# Patient Record
Sex: Female | Born: 1935 | Race: White | Hispanic: No | Smoking: Never smoker
Health system: Southern US, Community
[De-identification: ages and names within clinical notes are randomized; demographics above are authoritative.]

## PROBLEM LIST (undated history)

## (undated) DIAGNOSIS — F0390 Unspecified dementia without behavioral disturbance: Secondary | ICD-10-CM

## (undated) DIAGNOSIS — I499 Cardiac arrhythmia, unspecified: Secondary | ICD-10-CM

## (undated) DIAGNOSIS — N390 Urinary tract infection, site not specified: Secondary | ICD-10-CM

## (undated) HISTORY — PX: BREAST SURGERY: SHX581

## (undated) HISTORY — PX: ABDOMINAL HYSTERECTOMY: SHX81

## (undated) HISTORY — PX: CATARACT EXTRACTION: SUR2

---

## 2015-02-08 DIAGNOSIS — N390 Urinary tract infection, site not specified: Secondary | ICD-10-CM

## 2015-02-08 HISTORY — DX: Urinary tract infection, site not specified: N39.0

## 2016-02-07 ENCOUNTER — Inpatient Hospital Stay (HOSPITAL_COMMUNITY)
Admission: EM | Admit: 2016-02-07 | Discharge: 2016-02-09 | DRG: 689 | Disposition: A | Discharge status: Nursing Home (Includes ICF) | Payer: Medicare Other | Attending: Family Medicine | Admitting: Family Medicine

## 2016-02-07 ENCOUNTER — Emergency Department (HOSPITAL_COMMUNITY): Payer: Medicare Other

## 2016-02-07 ENCOUNTER — Encounter (HOSPITAL_COMMUNITY): Payer: Self-pay | Admitting: *Deleted

## 2016-02-07 DIAGNOSIS — E860 Dehydration: Secondary | ICD-10-CM | POA: Diagnosis present

## 2016-02-07 DIAGNOSIS — R4182 Altered mental status, unspecified: Secondary | ICD-10-CM

## 2016-02-07 DIAGNOSIS — G9340 Encephalopathy, unspecified: Secondary | ICD-10-CM | POA: Diagnosis present

## 2016-02-07 DIAGNOSIS — N390 Urinary tract infection, site not specified: Secondary | ICD-10-CM | POA: Diagnosis not present

## 2016-02-07 DIAGNOSIS — Z8249 Family history of ischemic heart disease and other diseases of the circulatory system: Secondary | ICD-10-CM

## 2016-02-07 DIAGNOSIS — I482 Chronic atrial fibrillation, unspecified: Secondary | ICD-10-CM | POA: Diagnosis present

## 2016-02-07 DIAGNOSIS — R410 Disorientation, unspecified: Secondary | ICD-10-CM

## 2016-02-07 DIAGNOSIS — F0390 Unspecified dementia without behavioral disturbance: Secondary | ICD-10-CM | POA: Diagnosis present

## 2016-02-07 HISTORY — DX: Urinary tract infection, site not specified: N39.0

## 2016-02-07 HISTORY — DX: Cardiac arrhythmia, unspecified: I49.9

## 2016-02-07 HISTORY — DX: Unspecified dementia, unspecified severity, without behavioral disturbance, psychotic disturbance, mood disturbance, and anxiety: F03.90

## 2016-02-07 LAB — CBC WITH DIFFERENTIAL/PLATELET
Basophils Absolute: 0 10*3/uL (ref 0.0–0.1)
Basophils Relative: 0 %
EOS PCT: 0 %
Eosinophils Absolute: 0 10*3/uL (ref 0.0–0.7)
HCT: 38.3 % (ref 36.0–46.0)
Hemoglobin: 13.2 g/dL (ref 12.0–15.0)
LYMPHS ABS: 2.1 10*3/uL (ref 0.7–4.0)
LYMPHS PCT: 17 %
MCH: 31.8 pg (ref 26.0–34.0)
MCHC: 34.5 g/dL (ref 30.0–36.0)
MCV: 92.3 fL (ref 78.0–100.0)
MONO ABS: 1.2 10*3/uL — AB (ref 0.1–1.0)
Monocytes Relative: 9 %
Neutro Abs: 9.1 10*3/uL — ABNORMAL HIGH (ref 1.7–7.7)
Neutrophils Relative %: 74 %
PLATELETS: 257 10*3/uL (ref 150–400)
RBC: 4.15 MIL/uL (ref 3.87–5.11)
RDW: 13.4 % (ref 11.5–15.5)
WBC: 12.4 10*3/uL — ABNORMAL HIGH (ref 4.0–10.5)

## 2016-02-07 LAB — URINALYSIS, ROUTINE W REFLEX MICROSCOPIC
Bilirubin Urine: NEGATIVE
GLUCOSE, UA: NEGATIVE mg/dL
KETONES UR: 5 mg/dL — AB
NITRITE: POSITIVE — AB
Protein, ur: NEGATIVE mg/dL
Specific Gravity, Urine: 1.017 (ref 1.005–1.030)
pH: 5 (ref 5.0–8.0)

## 2016-02-07 LAB — COMPREHENSIVE METABOLIC PANEL
ALT: 15 U/L (ref 14–54)
ANION GAP: 15 (ref 5–15)
AST: 22 U/L (ref 15–41)
Albumin: 3.5 g/dL (ref 3.5–5.0)
Alkaline Phosphatase: 55 U/L (ref 38–126)
BUN: 17 mg/dL (ref 6–20)
CHLORIDE: 97 mmol/L — AB (ref 101–111)
CO2: 21 mmol/L — ABNORMAL LOW (ref 22–32)
Calcium: 9.3 mg/dL (ref 8.9–10.3)
Creatinine, Ser: 0.98 mg/dL (ref 0.44–1.00)
GFR, EST NON AFRICAN AMERICAN: 53 mL/min — AB (ref >60)
Glucose, Bld: 100 mg/dL — ABNORMAL HIGH (ref 65–99)
POTASSIUM: 4.4 mmol/L (ref 3.5–5.1)
Sodium: 133 mmol/L — ABNORMAL LOW (ref 135–145)
Total Bilirubin: 0.7 mg/dL (ref 0.3–1.2)
Total Protein: 7 g/dL (ref 6.5–8.1)

## 2016-02-07 LAB — I-STAT CG4 LACTIC ACID, ED: Lactic Acid, Venous: 1.28 mmol/L (ref 0.5–1.9)

## 2016-02-07 NOTE — ED Triage Notes (Signed)
Pt bib ems from [facility], memory care unit; pt's daughter noted more confusion.  Hx of Alzheimers, dx Aug 2017.  Last seen normal 1700.  EKG unremarkable.  Stroke screen negative.  No other complaints per EMS.  134/86, A fib 90-120, CBG 140, Resp 16.

## 2016-02-07 NOTE — ED Provider Notes (Signed)
[facility]-EMERGENCY DEPT Provider Note   CSN: [id number] Arrival date & time: 02/07/16  2035     History   Chief Complaint Chief Complaint  Patient presents with  . Altered Mental Status    HPI Wanda AfricaBarbara Ramos is a 83 y.o. female.  The history is provided by the patient and a relative (both daughters are present at the bedside).  83 year old female with a past medical history of moderate dementia who presents today with altered mental status starting approximately 2 hours ago. Patient resides in an independent living at assisted living. Daughters relate that around 5 or 6 PM the patient had dinner. Later on that evening, the daughter was contacted by neighbor stated that the patient wanted to speak with her. Stated they noted that the patient was more confused than usual. States at baseline the patient is able to carry on a conversation. She is able to care for herself. They state that her behavior is very unusual for her.  Patient denies any hematuria, dysuria, cough, congestion, rash. Daughters do note that last week the patient had what seemed to be a cold. She is having cough and congestion. Was put on Mucinex for this.  History reviewed. No pertinent past medical history.  Patient Active Problem List   Diagnosis Date Noted  . Encephalopathy 02/08/2016    No past surgical history on file.  OB History    No data available       Home Medications    Prior to Admission medications   Medication Sig Start Date End Date Taking? Authorizing Provider  atenolol (TENORMIN) 25 MG tablet Take 25 mg by mouth daily.   Yes Historical Provider, MD  dabigatran (PRADAXA) 150 MG CAPS capsule Take 150 mg by mouth 2 (two) times daily.   Yes Historical Provider, MD  Flaxseed, Linseed, (FLAX SEED OIL) 1000 MG CAPS Take 1,000 mg by mouth at bedtime.   Yes Historical Provider, MD  flecainide (TAMBOCOR) 50 MG tablet Take 50 mg by mouth 2 (two) times daily.   Yes Historical Provider, MD  loratadine  (CLARITIN) 10 MG tablet Take 10 mg by mouth daily.   Yes Historical Provider, MD  loratadine-pseudoephedrine (CLARITIN-D 12-HOUR) 5-120 MG tablet Take 1 tablet by mouth 2 (two) times daily.   Yes Historical Provider, MD  vitamin B-12 (CYANOCOBALAMIN) 1000 MCG tablet Take 1,000 mcg by mouth daily.   Yes Historical Provider, MD  vitamin E 1000 UNIT capsule Take 1,000 Units by mouth daily.   Yes Historical Provider, MD    Family History No family history on file.  Social History Social History  Substance Use Topics  . Smoking status: Not on file  . Smokeless tobacco: Not on file  . Alcohol use Not on file     Allergies   Nitrofuran derivatives   Review of Systems Review of Systems  Constitutional: Positive for fatigue and fever. Negative for chills.  HENT: Positive for congestion (since last week).   Respiratory: Positive for cough. Negative for shortness of breath.   Gastrointestinal: Negative for diarrhea, nausea and vomiting.  Genitourinary: Negative for dysuria and hematuria.  Skin: Negative for pallor and rash.  Psychiatric/Behavioral: Positive for confusion (per daughters present at bedside).  All other systems reviewed and are negative.    Physical Exam Updated Vital Signs BP 126/78 (BP Location: Left Arm)   Pulse 97   Temp 100.6 F (38.1 C) (Oral)   Resp 16   SpO2 98%   Physical Exam  Constitutional: No distress.  Elderly and frail appearing  HENT:  Head: Normocephalic and atraumatic.  Eyes: Conjunctivae are normal.  Neck: Neck supple.  Cardiovascular: Normal rate and regular rhythm.   No murmur heard. Pulmonary/Chest: Effort normal and breath sounds normal. No respiratory distress.  Abdominal: Soft. There is no tenderness.  Musculoskeletal: Normal range of motion. She exhibits no edema.  Neurological: She is alert. She has normal strength. No cranial nerve deficit (CN II-XII intact) or sensory deficit. GCS eye subscore is 4. GCS verbal subscore is 4. GCS  motor subscore is 6.  Skin: Skin is warm and dry. She is not diaphoretic.  Psychiatric: She has a normal mood and affect.  Nursing note and vitals reviewed.    ED Treatments / Results  Labs (all labs ordered are listed, but only abnormal results are displayed) Labs Reviewed  CBC WITH DIFFERENTIAL/PLATELET - Abnormal; Notable for the following:       Result Value   WBC 12.4 (*)    Neutro Abs 9.1 (*)    Monocytes Absolute 1.2 (*)    All other components within normal limits  COMPREHENSIVE METABOLIC PANEL - Abnormal; Notable for the following:    Sodium 133 (*)    Chloride 97 (*)    CO2 21 (*)    Glucose, Bld 100 (*)    GFR calc non Af Amer 53 (*)    All other components within normal limits  URINALYSIS, ROUTINE W REFLEX MICROSCOPIC - Abnormal; Notable for the following:    APPearance HAZY (*)    Hgb urine dipstick MODERATE (*)    Ketones, ur 5 (*)    Nitrite POSITIVE (*)    Leukocytes, UA LARGE (*)    Bacteria, UA MANY (*)    Squamous Epithelial / LPF 0-5 (*)    All other components within normal limits  URINE CULTURE  CULTURE, BLOOD (ROUTINE X 2)  CULTURE, BLOOD (ROUTINE X 2)  I-STAT CG4 LACTIC ACID, ED  I-STAT CG4 LACTIC ACID, ED    EKG  EKG Interpretation  Date/Time:  Tuesday February 07 2016 21:41:35 EST Ventricular Rate:  113 PR Interval:    QRS Duration: 78 QT Interval:  336 QTC Calculation: 460 R Axis:   97 Text Interpretation:  Atrial flutter with variable A-V block Rightward axis Nonspecific ST abnormality Abnormal ECG No previous tracing Confirmed by KNOTT MD, DANIEL ([id number]) on 02/08/2016 12:13:55 AM       Radiology Ct Head Wo Contrast  Result Date: 02/07/2016 CLINICAL DATA:  Altered mental status, headache on anti coagulation. EXAM: CT HEAD WITHOUT CONTRAST TECHNIQUE: Contiguous axial images were obtained from the base of the skull through the vertex without intravenous contrast. COMPARISON:  None. FINDINGS: BRAIN: There is mild sulcal and moderate  ventricular prominence consistent with superficial and central atrophy. No intraparenchymal hemorrhage, mass effect nor midline shift. There are patchy periventricular and subcortical white matter hypodensities consistent with chronic small vessel ischemic disease. No acute large vascular territory infarcts. No abnormal extra-axial fluid collections. Basal cisterns are patent. VASCULAR: Moderate calcific atherosclerosis of the carotid siphons. SKULL: No skull fracture. No significant scalp soft tissue swelling. SINUSES/ORBITS: The mastoid air-cells are clear. The included paranasal sinuses are well-aerated.The included ocular globes and orbital contents are non-suspicious. OTHER: None. IMPRESSION: No acute intracranial abnormality. Superficial and central atrophy with chronic appearing small vessel ischemic disease of periventricular white matter. Electronically Signed   By: Tollie Eth M.D.   On: 02/07/2016 22:32    Procedures Procedures (including critical care time)  Medications Ordered in ED Medications  sodium chloride 0.9 % bolus 1,000 mL (not administered)  cefTRIAXone (ROCEPHIN) 1 g in dextrose 5 % 50 mL IVPB (not administered)  cefTRIAXone (ROCEPHIN) 1 g in dextrose 5 % 50 mL IVPB (not administered)     Initial Impression / Assessment and Plan / ED Course  I have reviewed the triage vital signs and the nursing notes.  Pertinent labs & imaging results that were available during my care of the patient were reviewed by me and considered in my medical decision making (see chart for details).  Clinical Course     Patient presents today with altered metal status starting approximately 2 hours prior to arrival. Patient has fever noted on arrival. She is nontoxic appearing but per daughters definitely has deviated from her baseline.  Patient is on anti-coagulation for atrial fibrillation. Obtain CT of her head without any Acute intracranial abnormalities. No evidence of pneumonia on chest  x-ray. Urinalysis consistent with acute urinary tract infection. Additionally patient has an elevated white blood cell count also supports the patient's likely infection. Cultures collected and given Rocephin.  Discussed with hospitalist and they're agreeable to admit. Patient stable the time of admission.  Final Clinical Impressions(s) / ED Diagnoses   Final diagnoses:  Lower urinary tract infectious disease  Delirium    New Prescriptions New Prescriptions   No medications on file     Madolyn Frieze, MD 02/08/16 0031    Lyndal Pulley, MD 02/08/16 [phone]

## 2016-02-08 ENCOUNTER — Encounter (HOSPITAL_COMMUNITY): Payer: Self-pay | Admitting: Internal Medicine

## 2016-02-08 DIAGNOSIS — F0390 Unspecified dementia without behavioral disturbance: Secondary | ICD-10-CM | POA: Diagnosis present

## 2016-02-08 DIAGNOSIS — N390 Urinary tract infection, site not specified: Secondary | ICD-10-CM | POA: Diagnosis not present

## 2016-02-08 DIAGNOSIS — E860 Dehydration: Secondary | ICD-10-CM | POA: Diagnosis present

## 2016-02-08 DIAGNOSIS — G9340 Encephalopathy, unspecified: Secondary | ICD-10-CM | POA: Diagnosis present

## 2016-02-08 DIAGNOSIS — R4182 Altered mental status, unspecified: Secondary | ICD-10-CM | POA: Diagnosis present

## 2016-02-08 DIAGNOSIS — Z8249 Family history of ischemic heart disease and other diseases of the circulatory system: Secondary | ICD-10-CM | POA: Diagnosis not present

## 2016-02-08 DIAGNOSIS — I482 Chronic atrial fibrillation, unspecified: Secondary | ICD-10-CM | POA: Diagnosis present

## 2016-02-08 LAB — BASIC METABOLIC PANEL
Anion gap: 10 (ref 5–15)
BUN: 13 mg/dL (ref 6–20)
CALCIUM: 8.4 mg/dL — AB (ref 8.9–10.3)
CHLORIDE: 102 mmol/L (ref 101–111)
CO2: 22 mmol/L (ref 22–32)
CREATININE: 0.88 mg/dL (ref 0.44–1.00)
GFR calc non Af Amer: 60 mL/min — ABNORMAL LOW (ref >60)
Glucose, Bld: 108 mg/dL — ABNORMAL HIGH (ref 65–99)
Potassium: 3.9 mmol/L (ref 3.5–5.1)
SODIUM: 134 mmol/L — AB (ref 135–145)

## 2016-02-08 LAB — CBC
HCT: 36.8 % (ref 36.0–46.0)
Hemoglobin: 12.4 g/dL (ref 12.0–15.0)
MCH: 31 pg (ref 26.0–34.0)
MCHC: 33.7 g/dL (ref 30.0–36.0)
MCV: 92 fL (ref 78.0–100.0)
PLATELETS: 253 10*3/uL (ref 150–400)
RBC: 4 MIL/uL (ref 3.87–5.11)
RDW: 13.4 % (ref 11.5–15.5)
WBC: 13.5 10*3/uL — AB (ref 4.0–10.5)

## 2016-02-08 LAB — CBG MONITORING, ED
Glucose-Capillary: 122 mg/dL — ABNORMAL HIGH (ref 65–99)
Glucose-Capillary: 105 mg/dL — ABNORMAL HIGH (ref 65–99)

## 2016-02-08 LAB — GLUCOSE, CAPILLARY
Glucose-Capillary: 81 mg/dL (ref 65–99)
Glucose-Capillary: 149 mg/dL — ABNORMAL HIGH (ref 65–99)

## 2016-02-08 LAB — INFLUENZA PANEL BY PCR (TYPE A & B)
INFLAPCR: NEGATIVE
Influenza B By PCR: NEGATIVE

## 2016-02-08 LAB — HEPARIN LEVEL (UNFRACTIONATED)
HEPARIN UNFRACTIONATED: 0.42 [IU]/mL (ref 0.30–0.70)
Heparin Unfractionated: 0.1 IU/mL — ABNORMAL LOW (ref 0.30–0.70)

## 2016-02-08 MED ORDER — SODIUM CHLORIDE 0.9 % IV BOLUS (SEPSIS)
1000.0000 mL | Freq: Once | INTRAVENOUS | Status: AC
  Administered 2016-02-08: 1000 mL via INTRAVENOUS

## 2016-02-08 MED ORDER — DEXTROSE 5 % IV SOLN
1.0000 g | INTRAVENOUS | Status: DC
  Administered 2016-02-09: 1 g via INTRAVENOUS
  Filled 2016-02-08: qty 10

## 2016-02-08 MED ORDER — ACETAMINOPHEN 650 MG RE SUPP: 650.0000 mg | Freq: Four times a day (QID) | RECTAL | Status: DC | PRN

## 2016-02-08 MED ORDER — METOPROLOL TARTRATE 5 MG/5ML IV SOLN
2.5000 mg | Freq: Three times a day (TID) | INTRAVENOUS | Status: DC
  Administered 2016-02-08: 2.5 mg via INTRAVENOUS
  Filled 2016-02-08 (×3): qty 5

## 2016-02-08 MED ORDER — SODIUM CHLORIDE 0.9 % IV SOLN
INTRAVENOUS | Status: AC
  Administered 2016-02-08 (×2): via INTRAVENOUS

## 2016-02-08 MED ORDER — ONDANSETRON HCL 4 MG PO TABS: 4.0000 mg | ORAL_TABLET | Freq: Four times a day (QID) | ORAL | Status: DC | PRN

## 2016-02-08 MED ORDER — HEPARIN (PORCINE) IN NACL 100-0.45 UNIT/ML-% IJ SOLN
850.0000 [IU]/h | INTRAMUSCULAR | Status: DC
  Administered 2016-02-08: 700 [IU]/h via INTRAVENOUS
  Filled 2016-02-08: qty 250

## 2016-02-08 MED ORDER — FLECAINIDE ACETATE 50 MG PO TABS
50.0000 mg | ORAL_TABLET | Freq: Two times a day (BID) | ORAL | Status: DC
  Administered 2016-02-08 – 2016-02-09 (×3): 50 mg via ORAL
  Filled 2016-02-08 (×3): qty 1

## 2016-02-08 MED ORDER — CEFTRIAXONE SODIUM 1 G IJ SOLR
1.0000 g | Freq: Once | INTRAMUSCULAR | Status: AC
  Administered 2016-02-08: 1 g via INTRAVENOUS
  Filled 2016-02-08: qty 10

## 2016-02-08 MED ORDER — ONDANSETRON HCL 4 MG/2ML IJ SOLN: 4.0000 mg | Freq: Four times a day (QID) | INTRAMUSCULAR | Status: DC | PRN

## 2016-02-08 MED ORDER — ACETAMINOPHEN 325 MG PO TABS
650.0000 mg | ORAL_TABLET | Freq: Four times a day (QID) | ORAL | Status: DC | PRN
  Administered 2016-02-08 – 2016-02-09 (×2): 650 mg via ORAL
  Filled 2016-02-08 (×4): qty 2

## 2016-02-08 NOTE — Progress Notes (Signed)
Pharmacy Antibiotic Note  Wanda AfricaBarbara Witter is a 83 y.o. female admitted on 02/07/2016 with UTI.  Pharmacy has been consulted for Rocephin dosing.  Plan: Rocephin 1g IV Q24H.  Pharmacy will sign off.   Temp (24hrs), Avg:100.6 F (38.1 C), Min:100.6 F (38.1 C), Max:100.6 F (38.1 C)   Recent Labs Lab 02/07/16 2318 02/07/16 2329  WBC 12.4*  --   CREATININE 0.98  --   LATICACIDVEN  --  1.28     Allergies  Allergen Reactions  . Nitrofuran Derivatives Other (See Comments)    On MAR     Thank you for allowing pharmacy to be a part of this patient's care.  Vernard GamblesVeronda Richey Doolittle, PharmD, BCPS  02/08/2016 12:15 AM

## 2016-02-08 NOTE — Progress Notes (Signed)
ANTICOAGULATION CONSULT NOTE - Follow Up Consult  Pharmacy Consult for heparin Indication: atrial fibrillation  Labs:  Recent Labs  02/07/16 2318 02/08/16 0440 02/08/16 1314 02/08/16 2244  HGB 13.2 12.4  --   --   HCT 38.3 36.8  --   --   PLT 257 253  --   --   HEPARINUNFRC  --   --  0.10* 0.42  CREATININE 0.98 0.88  --   --     Assessment/Plan:  83yo female therapeutic on heparin after rate change. Will continue gtt at current rate and confirm stable with am labs.   Wanda GamblesVeronda Joaquim Tolen, PharmD, BCPS  02/08/2016,11:45 PM

## 2016-02-08 NOTE — Progress Notes (Signed)
ANTICOAGULATION CONSULT NOTE - Initial Consult  Pharmacy Consult for heparin Indication: atrial fibrillation  Allergies  Allergen Reactions  . Nitrofuran Derivatives Other (See Comments)    On MAR    Patient Measurements: Height: 5\' 3"  (160 cm) Weight: 113 lb 8 oz (51.5 kg) IBW/kg (Calculated) : 52.4  Vital Signs: Temp: 100.6 F (38.1 C) (01/02 2048) Temp Source: Oral (01/02 2048) BP: 128/78 (01/03 0215) Pulse Rate: 124 (01/03 0215)  Labs:  Recent Labs  02/07/16 2318  HGB 13.2  HCT 38.3  PLT 257  CREATININE 0.98    Estimated Creatinine Clearance: 36.6 mL/min (by C-G formula based on SCr of 0.98 mg/dL).   Medical History: Past Medical History:  Diagnosis Date  . Dysrhythmia     Assessment: 83yo female presents w/ worsening confusion (Alzheimer's Dx in August), found w/ UTI, to transition from Pradaxa to heparin gtt; last dose of Pradaxa 1/2 at 0800.  Goal of Therapy:  Heparin level 0.3-0.7 units/ml Monitor platelets by anticoagulation protocol: Yes   Plan:  Will begin heparin gtt at 700 units/hr and monitor heparin levels and CBC.  Vernard GamblesVeronda Johnathan Heskett, PharmD, BCPS  02/08/2016,3:11 AM

## 2016-02-08 NOTE — ED Notes (Signed)
Regular Diet Ordered for Patient. 

## 2016-02-08 NOTE — Progress Notes (Signed)
ANTICOAGULATION CONSULT NOTE   Pharmacy Consult for heparin Indication: atrial fibrillation  Allergies  Allergen Reactions  . Nitrofuran Derivatives Other (See Comments)    On MAR    Patient Measurements: Height: 5\' 3"  (160 cm) Weight: 113 lb 8 oz (51.5 kg) IBW/kg (Calculated) : 52.4  Vital Signs: Temp: 98.8 F (37.1 C) (01/03 0946) Temp Source: Oral (01/03 0946) BP: 110/64 (01/03 1245) Pulse Rate: 118 (01/03 1245)  Labs:  Recent Labs  02/07/16 2318 02/08/16 0440 02/08/16 1314  HGB 13.2 12.4  --   HCT 38.3 36.8  --   PLT 257 253  --   HEPARINUNFRC  --   --  0.10*  CREATININE 0.98 0.88  --     Estimated Creatinine Clearance: 40.8 mL/min (by C-G formula based on SCr of 0.88 mg/dL).  Assessment: 83yo female presents w/ worsening confusion (Alzheimer's Dx in August), found w/ UTI. She is on chronic pradaxa for history of afib but now transitioned to IV heparin. Initial heparin level is subtherapeutic at 0.1. No bleeding or other issues noted.   Goal of Therapy:  Heparin level 0.3-0.7 units/ml Monitor platelets by anticoagulation protocol: Yes   Plan:  Increase heparin gtt to 850 units/hr Check an 8 hr heparin level Daily heparin level and CBC  Lysle Pearlachel Ahlia Lemanski, PharmD, BCPS 02/08/2016 2:02 PM

## 2016-02-08 NOTE — Progress Notes (Signed)
Patient became increasingly alert and oriented after her arrival to 2W.  Patient was able to follow commands and answer questions appropriately.

## 2016-02-08 NOTE — ED Notes (Signed)
Patient became very restless, daughter stated shje gets like this when she has to use the bathroom. Assisted patient up to bedside commode patient  was incont. Of urine cleaned linens and repositioned in bed

## 2016-02-08 NOTE — H&P (Signed)
History and Physical    Wanda AfricaBarbara Talaga AVW:[id number] DOB: [birth year] DOA: 02/07/2016  PCP: Keane PoliceSLADE-HARTMAN, VENEZELA, MD  Patient coming from: Nursing home.  Chief Complaint: Confusion.  History provided. Patient's daughter.  HPI: Wanda Ramos is a 83 y.o. female with history of atrial fibrillation, dementia was brought to the ER after patient was found to be slightly confused last evening around 7 PM. As per patient's daughter patient did not have any nausea vomiting diarrhea chest pain or shortness of breath. Last one week patient has been having congestion for which patient has been taking antiallergy medications. In the ER patient is found be confused with fever and UA showing features consistent with UTI. On my exam patient is still confused but oriented to her name. CT head was unremarkable. Since patient also had throat congestion last chest x-ray was ordered along with influenza PCR which are pending.   ED Course: Patient was started on ceftriaxone for UTI. CT head was unremarkable.  Review of Systems: As per HPI, rest all negative.   Past Medical History:  Diagnosis Date  . Dysrhythmia     Past Surgical History:  Procedure Laterality Date  . BREAST SURGERY       reports that she has never smoked. She has never used smokeless tobacco. She reports that she does not drink alcohol or use drugs.  Allergies  Allergen Reactions  . Nitrofuran Derivatives Other (See Comments)    On MAR    Family History  Problem Relation Age of Onset  . Congestive Heart Failure Father     Prior to Admission medications   Medication Sig Start Date End Date Taking? Authorizing Provider  atenolol (TENORMIN) 25 MG tablet Take 25 mg by mouth daily.   Yes Historical Provider, MD  dabigatran (PRADAXA) 150 MG CAPS capsule Take 150 mg by mouth 2 (two) times daily.   Yes Historical Provider, MD  Flaxseed, Linseed, (FLAX SEED OIL) 1000 MG CAPS Take 1,000 mg by mouth at bedtime.   Yes Historical  Provider, MD  flecainide (TAMBOCOR) 50 MG tablet Take 50 mg by mouth 2 (two) times daily.   Yes Historical Provider, MD  loratadine (CLARITIN) 10 MG tablet Take 10 mg by mouth daily.   Yes Historical Provider, MD  loratadine-pseudoephedrine (CLARITIN-D 12-HOUR) 5-120 MG tablet Take 1 tablet by mouth 2 (two) times daily.   Yes Historical Provider, MD  vitamin B-12 (CYANOCOBALAMIN) 1000 MCG tablet Take 1,000 mcg by mouth daily.   Yes Historical Provider, MD  vitamin E 1000 UNIT capsule Take 1,000 Units by mouth daily.   Yes Historical Provider, MD    Physical Exam: Vitals:   02/07/16 2048 02/07/16 2348 02/08/16 0215  BP: 124/62 126/78 128/78  Pulse: 95 97 (!) 124  Resp: 19 16 20   Temp: 100.6 F (38.1 C)    TempSrc: Oral    SpO2: 92% 98% 98%      Constitutional: Moderately built and nourished. Vitals:   02/07/16 2048 02/07/16 2348 02/08/16 0215  BP: 124/62 126/78 128/78  Pulse: 95 97 (!) 124  Resp: 19 16 20   Temp: 100.6 F (38.1 C)    TempSrc: Oral    SpO2: 92% 98% 98%   Eyes: Anicteric no pallor. ENMT: No discharge from the ears eyes nose and mouth. Neck: No mass felt. No neck rigidity. Respiratory: No rhonchi or crepitations. Cardiovascular: S1 and S2 heard. Abdomen: Soft nontender bowel sounds present. No guarding or rigidity. Musculoskeletal: No edema. No joint effusion. Skin: No rash. Skin  appears warm. Neurologic: Patient is lethargic and confused. Psychiatric: Patient is lethargic and confused.   Labs on Admission: I have personally reviewed following labs and imaging studies  CBC:  Recent Labs Lab 02/07/16 2318  WBC 12.4*  NEUTROABS 9.1*  HGB 13.2  HCT 38.3  MCV 92.3  PLT 257   Basic Metabolic Panel:  Recent Labs Lab 02/07/16 2318  NA 133*  K 4.4  CL 97*  CO2 21*  GLUCOSE 100*  BUN 17  CREATININE 0.98  CALCIUM 9.3   GFR: CrCl cannot be calculated (Unknown ideal weight.). Liver Function Tests:  Recent Labs Lab 02/07/16 2318  AST 22    ALT 15  ALKPHOS 55  BILITOT 0.7  PROT 7.0  ALBUMIN 3.5   No results for input(s): LIPASE, AMYLASE in the last 168 hours. No results for input(s): AMMONIA in the last 168 hours. Coagulation Profile: No results for input(s): INR, PROTIME in the last 168 hours. Cardiac Enzymes: No results for input(s): CKTOTAL, CKMB, CKMBINDEX, TROPONINI in the last 168 hours. BNP (last 3 results) No results for input(s): PROBNP in the last 8760 hours. HbA1C: No results for input(s): HGBA1C in the last 72 hours. CBG: No results for input(s): GLUCAP in the last 168 hours. Lipid Profile: No results for input(s): CHOL, HDL, LDLCALC, TRIG, CHOLHDL, LDLDIRECT in the last 72 hours. Thyroid Function Tests: No results for input(s): TSH, T4TOTAL, FREET4, T3FREE, THYROIDAB in the last 72 hours. Anemia Panel: No results for input(s): VITAMINB12, FOLATE, FERRITIN, TIBC, IRON, RETICCTPCT in the last 72 hours. Urine analysis:    Component Value Date/Time   COLORURINE YELLOW 02/07/2016 2310   APPEARANCEUR HAZY (A) 02/07/2016 2310   LABSPEC 1.017 02/07/2016 2310   PHURINE 5.0 02/07/2016 2310   GLUCOSEU NEGATIVE 02/07/2016 2310   HGBUR MODERATE (A) 02/07/2016 2310   BILIRUBINUR NEGATIVE 02/07/2016 2310   KETONESUR 5 (A) 02/07/2016 2310   PROTEINUR NEGATIVE 02/07/2016 2310   NITRITE POSITIVE (A) 02/07/2016 2310   LEUKOCYTESUR LARGE (A) 02/07/2016 2310   Sepsis Labs: @LABRCNTIP (procalcitonin:4,lacticidven:4) )No results found for this or any previous visit (from the past 240 hour(s)).   Radiological Exams on Admission: Ct Head Wo Contrast  Result Date: 02/07/2016 CLINICAL DATA:  Altered mental status, headache on anti coagulation. EXAM: CT HEAD WITHOUT CONTRAST TECHNIQUE: Contiguous axial images were obtained from the base of the skull through the vertex without intravenous contrast. COMPARISON:  None. FINDINGS: BRAIN: There is mild sulcal and moderate ventricular prominence consistent with superficial and  central atrophy. No intraparenchymal hemorrhage, mass effect nor midline shift. There are patchy periventricular and subcortical white matter hypodensities consistent with chronic small vessel ischemic disease. No acute large vascular territory infarcts. No abnormal extra-axial fluid collections. Basal cisterns are patent. VASCULAR: Moderate calcific atherosclerosis of the carotid siphons. SKULL: No skull fracture. No significant scalp soft tissue swelling. SINUSES/ORBITS: The mastoid air-cells are clear. The included paranasal sinuses are well-aerated.The included ocular globes and orbital contents are non-suspicious. OTHER: None. IMPRESSION: No acute intracranial abnormality. Superficial and central atrophy with chronic appearing small vessel ischemic disease of periventricular white matter. Electronically Signed   By: Tollie Eth M.D.   On: 02/07/2016 22:32     Assessment/Plan Principal Problem:   Acute encephalopathy Active Problems:   Encephalopathy   Chronic atrial fibrillation (HCC)   Dementia   Lower urinary tract infectious disease    1. Acute encephalopathy most likely from UTI - patient is placed on ceftriaxone. Chest x-ray and influenza PCR pending. Continue with  hydration. If patient remains confused may need MRI brain. For now I'm keeping patient nothing by mouth until patient becomes more alert and awake. 2. Chronic atrial fibrillation - since patient is nothing by mouth I have placed patient on IV heparin. Patient's last dose of Pradaxa was yesterday morning. Patient is on beta blockers which I will order as IV until patient can swallow orally. Patient also takes Flecainide. 3. Dementia - presently encephalopathy.   DVT prophylaxis: Heparin. Code Status: Full code. But patient's daughter states that patient would not like to be on prolonged life-support.  Family Communication: Patient's daughter.  Disposition Plan: Back to nursing home.  Consults called: None.  Admission  status: Observation.    Eduard Clos MD [facility] Pager [phone].  If 7PM-7AM, please contact night-coverage [email or web address] Password [email or web address]  02/08/2016, 2:54 AM

## 2016-02-08 NOTE — ED Notes (Signed)
Sat patient up on the side of the bed she drank small amt . Of cranberry juice and took her medication. Daughter remains at bedside.

## 2016-02-08 NOTE — Progress Notes (Signed)
Patient seen and admitted earlier this AM by my associate. Please refer to H and P for details regarding assessment and plan.  Will reassess next am.  Gen: pt in nad CV: no cyanosis Pulm: equal chest rise  [name], Wanda Ramos

## 2016-02-08 NOTE — Progress Notes (Signed)
Patient arrived to 2W room 33.  Patient was oriented to unit and room to include call light and phone.  Telemetry monitor was applied and CCMD notified.  Will continue to monitor.

## 2016-02-08 NOTE — ED Notes (Signed)
Patient daughter came to desk and said her mother needed to void , so I went into room to assist patient to bedside toilet, and Patient told me she didn't have to void, so I said ok. And Daughter told me Thank you for coming. I told her you welcome.

## 2016-02-09 LAB — CBC
HCT: 37.5 % (ref 36.0–46.0)
Hemoglobin: 12.5 g/dL (ref 12.0–15.0)
MCH: 30.6 pg (ref 26.0–34.0)
MCHC: 33.3 g/dL (ref 30.0–36.0)
MCV: 91.9 fL (ref 78.0–100.0)
Platelets: 271 10*3/uL (ref 150–400)
RBC: 4.08 MIL/uL (ref 3.87–5.11)
RDW: 13.3 % (ref 11.5–15.5)
WBC: 11.2 10*3/uL — ABNORMAL HIGH (ref 4.0–10.5)

## 2016-02-09 LAB — GLUCOSE, CAPILLARY
GLUCOSE-CAPILLARY: 88 mg/dL (ref 65–99)
GLUCOSE-CAPILLARY: 98 mg/dL (ref 65–99)
Glucose-Capillary: 95 mg/dL (ref 65–99)
Glucose-Capillary: 185 mg/dL — ABNORMAL HIGH (ref 65–99)

## 2016-02-09 LAB — HEPARIN LEVEL (UNFRACTIONATED): HEPARIN UNFRACTIONATED: 0.34 [IU]/mL (ref 0.30–0.70)

## 2016-02-09 MED ORDER — DABIGATRAN ETEXILATE MESYLATE 150 MG PO CAPS
150.0000 mg | ORAL_CAPSULE | Freq: Two times a day (BID) | ORAL | Status: DC
  Administered 2016-02-09: 150 mg via ORAL
  Filled 2016-02-09: qty 1

## 2016-02-09 MED ORDER — CEFDINIR 300 MG PO CAPS: 300.0000 mg | ORAL_CAPSULE | Freq: Two times a day (BID) | ORAL | 0 refills | Status: AC

## 2016-02-09 MED ORDER — ATENOLOL 25 MG PO TABS
25.0000 mg | ORAL_TABLET | Freq: Every day | ORAL | Status: DC
  Administered 2016-02-09: 25 mg via ORAL
  Filled 2016-02-09: qty 1

## 2016-02-09 NOTE — Care Management Note (Signed)
Case Management Note Donn PieriniKristi Truman Aceituno RN, BSN Unit 2W-Case Manager [phone]  Patient Details  Name: Wanda AfricaBarbara Ramos MRN: [id number] Date of Birth: 1932-12-19  Subjective/Objective:  Pt admitted with acute encephalopathy                  Action/Plan: P[facility]- plan to return to ALF- CSW assisting with d/c needs to return to facility.   Expected Discharge Date:      02/09/16            Expected Discharge Plan:  Assisted Living / Rest Home  In-House Referral:  Clinical Social Work  Discharge planning Services  CM Consult  Post Acute Care Choice:    Choice offered to:     DME Arranged:    DME Agency:     HH Arranged:    HH Agency:     Status of Service:  Completed, signed off  If discussed at [employer] Meetings, dates discussed:    Additional Comments:  Darrold SpanWebster, Yoshito Gaza Hall, RN 02/09/2016, 11:19 AM

## 2016-02-09 NOTE — Progress Notes (Signed)
Pt/family given discharge instructions, medication lists, follow up appointments, and when to call the doctor.  Pt/family verbalizes understanding. [facility]  Faxed copy of antibiotic prescription to facility to ensure availability. [name], [name], RN

## 2016-02-09 NOTE — Discharge Summary (Addendum)
Physician Discharge Summary  Wanda Ramos RUE:[id number] DOB: 1933-01-10 DOA: 02/07/2016  PCP: Keane Police, MD  Admit date: 02/07/2016 Discharge date: 02/09/2016  Time spent: > 35 minutes  Recommendations for Outpatient Follow-up:  1. Monitor white blood cell count 2. Monitor blood pressure at home 3. Follow up with urine culture results at hospital   Discharge Diagnoses:  Principal Problem:   Acute encephalopathy Active Problems:   Encephalopathy   Chronic atrial fibrillation (HCC)   Dementia   Lower urinary tract infectious disease   Discharge Condition: Stable  Diet recommendation: Heart healthy  Filed Weights   02/08/16 0307 02/08/16 1407  Weight: 51.5 kg (113 lb 8 oz) 49.1 kg (108 lb 4.8 oz)    History of present illness:  83 y.o. female with history of atrial fibrillation, dementia was brought to the ER after patient was found to be slightly confused. After evaluation patient was diagnosed with urinary tract infection.  Hospital Course:  Urinary tract infection - We'll continue Rocephin - Third generation cephalosporin as patient's condition improved on this. - Patient afebrile with blood cell count near-normal on day of discharge. Family reports patient's mentation back at baseline - We will treat with a prolonged antibiotic treatment regimen. Patient will require 6 more days to complete a seven-day treatment course  Altered mental status -Most likely secondary to dehydration and UTI. Improved with IV fluids and antibiotic regimen.  Procedures:  None  Consultations:  None  Discharge Exam: Vitals:   02/09/16 0413 02/09/16 0500  BP: 122/72 115/60  Pulse: (!) 101 82  Resp: 20   Temp: 99.5 F (37.5 C) 100 F (37.8 C)    General: Pt in nad. Alert and awake Cardiovascular: Irr, no rubs Respiratory: no increased wob, no wheezes  Discharge Instructions   Discharge Instructions    Call MD for:  extreme fatigue    Complete by:  As  directed    Call MD for:  temperature >100.4    Complete by:  As directed    Diet - low sodium heart healthy    Complete by:  As directed    Discharge instructions    Complete by:  As directed    Please be sure to have patient follow up with primary care physician next one or 2 weeks or sooner should any new concerns arise. Ensure patient finishes antibiotic regimen.   Increase activity slowly    Complete by:  As directed      Current Discharge Medication List    START taking these medications   Details  cefdinir (OMNICEF) 300 MG capsule Take 1 capsule (300 mg total) by mouth 2 (two) times daily. Qty: 12 capsule, Refills: 0      CONTINUE these medications which have NOT CHANGED   Details  atenolol (TENORMIN) 25 MG tablet Take 25 mg by mouth daily.    dabigatran (PRADAXA) 150 MG CAPS capsule Take 150 mg by mouth 2 (two) times daily.    Flaxseed, Linseed, (FLAX SEED OIL) 1000 MG CAPS Take 1,000 mg by mouth at bedtime.    flecainide (TAMBOCOR) 50 MG tablet Take 50 mg by mouth 2 (two) times daily.    vitamin B-12 (CYANOCOBALAMIN) 1000 MCG tablet Take 1,000 mcg by mouth daily.    vitamin E 1000 UNIT capsule Take 1,000 Units by mouth daily.      STOP taking these medications     loratadine (CLARITIN) 10 MG tablet      loratadine-pseudoephedrine (CLARITIN-D 12-HOUR) 5-120 MG tablet  Allergies  Allergen Reactions  . Nitrofuran Derivatives Other (See Comments)    On MAR      The results of significant diagnostics from this hospitalization (including imaging, microbiology, ancillary and laboratory) are listed below for reference.    Significant Diagnostic Studies: Dg Chest 2 View  Result Date: 02/08/2016 CLINICAL DATA:  [age] year old female with altered mental status. EXAM: CHEST  2 VIEW COMPARISON:  No priors. FINDINGS: Lung volumes are normal. No consolidative airspace disease. No pleural effusions. No pneumothorax. Several small nodular densities are noted in the  lungs bilaterally, 1 in the left apex, and 2 new the right lung base. These are not well demonstrated on the lateral projection. Pulmonary vasculature and the cardiomediastinal silhouette are within normal limits. Surgical clips in the right axillary region, likely from prior lymph node dissection. IMPRESSION: 1. No radiographic evidence of acute cardiopulmonary disease. 2. Several small nodular densities noted in the lungs bilaterally. Repeat standing PA and lateral chest radiograph is recommended in 1-2 months to confirm the persistence of these findings. Should these nodules persist, further evaluation with nonemergent noncontrast chest CT would be recommended. Electronically Signed   By: Trudie Reedaniel  Entrikin M.D.   On: 02/08/2016 09:03   Ct Head Wo Contrast  Result Date: 02/07/2016 CLINICAL DATA:  Altered mental status, headache on anti coagulation. EXAM: CT HEAD WITHOUT CONTRAST TECHNIQUE: Contiguous axial images were obtained from the base of the skull through the vertex without intravenous contrast. COMPARISON:  None. FINDINGS: BRAIN: There is mild sulcal and moderate ventricular prominence consistent with superficial and central atrophy. No intraparenchymal hemorrhage, mass effect nor midline shift. There are patchy periventricular and subcortical white matter hypodensities consistent with chronic small vessel ischemic disease. No acute large vascular territory infarcts. No abnormal extra-axial fluid collections. Basal cisterns are patent. VASCULAR: Moderate calcific atherosclerosis of the carotid siphons. SKULL: No skull fracture. No significant scalp soft tissue swelling. SINUSES/ORBITS: The mastoid air-cells are clear. The included paranasal sinuses are well-aerated.The included ocular globes and orbital contents are non-suspicious. OTHER: None. IMPRESSION: No acute intracranial abnormality. Superficial and central atrophy with chronic appearing small vessel ischemic disease of periventricular white  matter. Electronically Signed   By: Tollie Ethavid  Kwon M.D.   On: 02/07/2016 22:32    Microbiology: No results found for this or any previous visit (from the past 240 hour(s)).   Labs: Basic Metabolic Panel:  Recent Labs Lab 02/07/16 2318 02/08/16 0440  NA 133* 134*  K 4.4 3.9  CL 97* 102  CO2 21* 22  GLUCOSE 100* 108*  BUN 17 13  CREATININE 0.98 0.88  CALCIUM 9.3 8.4*   Liver Function Tests:  Recent Labs Lab 02/07/16 2318  AST 22  ALT 15  ALKPHOS 55  BILITOT 0.7  PROT 7.0  ALBUMIN 3.5   No results for input(s): LIPASE, AMYLASE in the last 168 hours. No results for input(s): AMMONIA in the last 168 hours. CBC:  Recent Labs Lab 02/07/16 2318 02/08/16 0440 02/09/16 0257  WBC 12.4* 13.5* 11.2*  NEUTROABS 9.1*  --   --   HGB 13.2 12.4 12.5  HCT 38.3 36.8 37.5  MCV 92.3 92.0 91.9  PLT 257 253 271   Cardiac Enzymes: No results for input(s): CKTOTAL, CKMB, CKMBINDEX, TROPONINI in the last 168 hours. BNP: BNP (last 3 results) No results for input(s): BNP in the last 8760 hours.  ProBNP (last 3 results) No results for input(s): PROBNP in the last 8760 hours.  CBG:  Recent Labs Lab 02/08/16 1611 02/08/16  2006 02/09/16 0011 02/09/16 0411 02/09/16 0805  GLUCAP 81 149* 88 95 98   Signed:  Penny Pia MD.  [facility] 02/09/2016, 9:54 AM   Addendum: Patient was initially somnolent and made nothing by mouth. She has become more alert and family reports feeding her food yesterday without any problems.

## 2016-02-09 NOTE — Progress Notes (Signed)
Clinical Social Worker facilitated patient discharge including contacting patient family and facility to confirm patient discharge plans.  Clinical information faxed to facility and family agreeable with plan.  Patient daughter stated she will drive patient back to facility via personalvechicle .  RN Dois DavenportSandra to call [phone] report prior to discharge.  Clinical Social Worker will sign off for now as social work intervention is no longer needed. Please consult us again if new need arises.  Marrianne MoodAshley Luisalberto Beegle, MSW, [employer]CSWA [phone]

## 2016-02-10 LAB — URINE CULTURE

## 2016-02-13 LAB — CULTURE, BLOOD (ROUTINE X 2)
CULTURE: NO GROWTH
CULTURE: NO GROWTH

## 2018-01-18 IMAGING — CR DG CHEST 2V
2 series · 2 of 2 positions shown · non-contrast
Comparison: No priors.

CLINICAL DATA: 83-year-old female with altered mental status.

EXAM:
CHEST  2 VIEW

[chest lat]
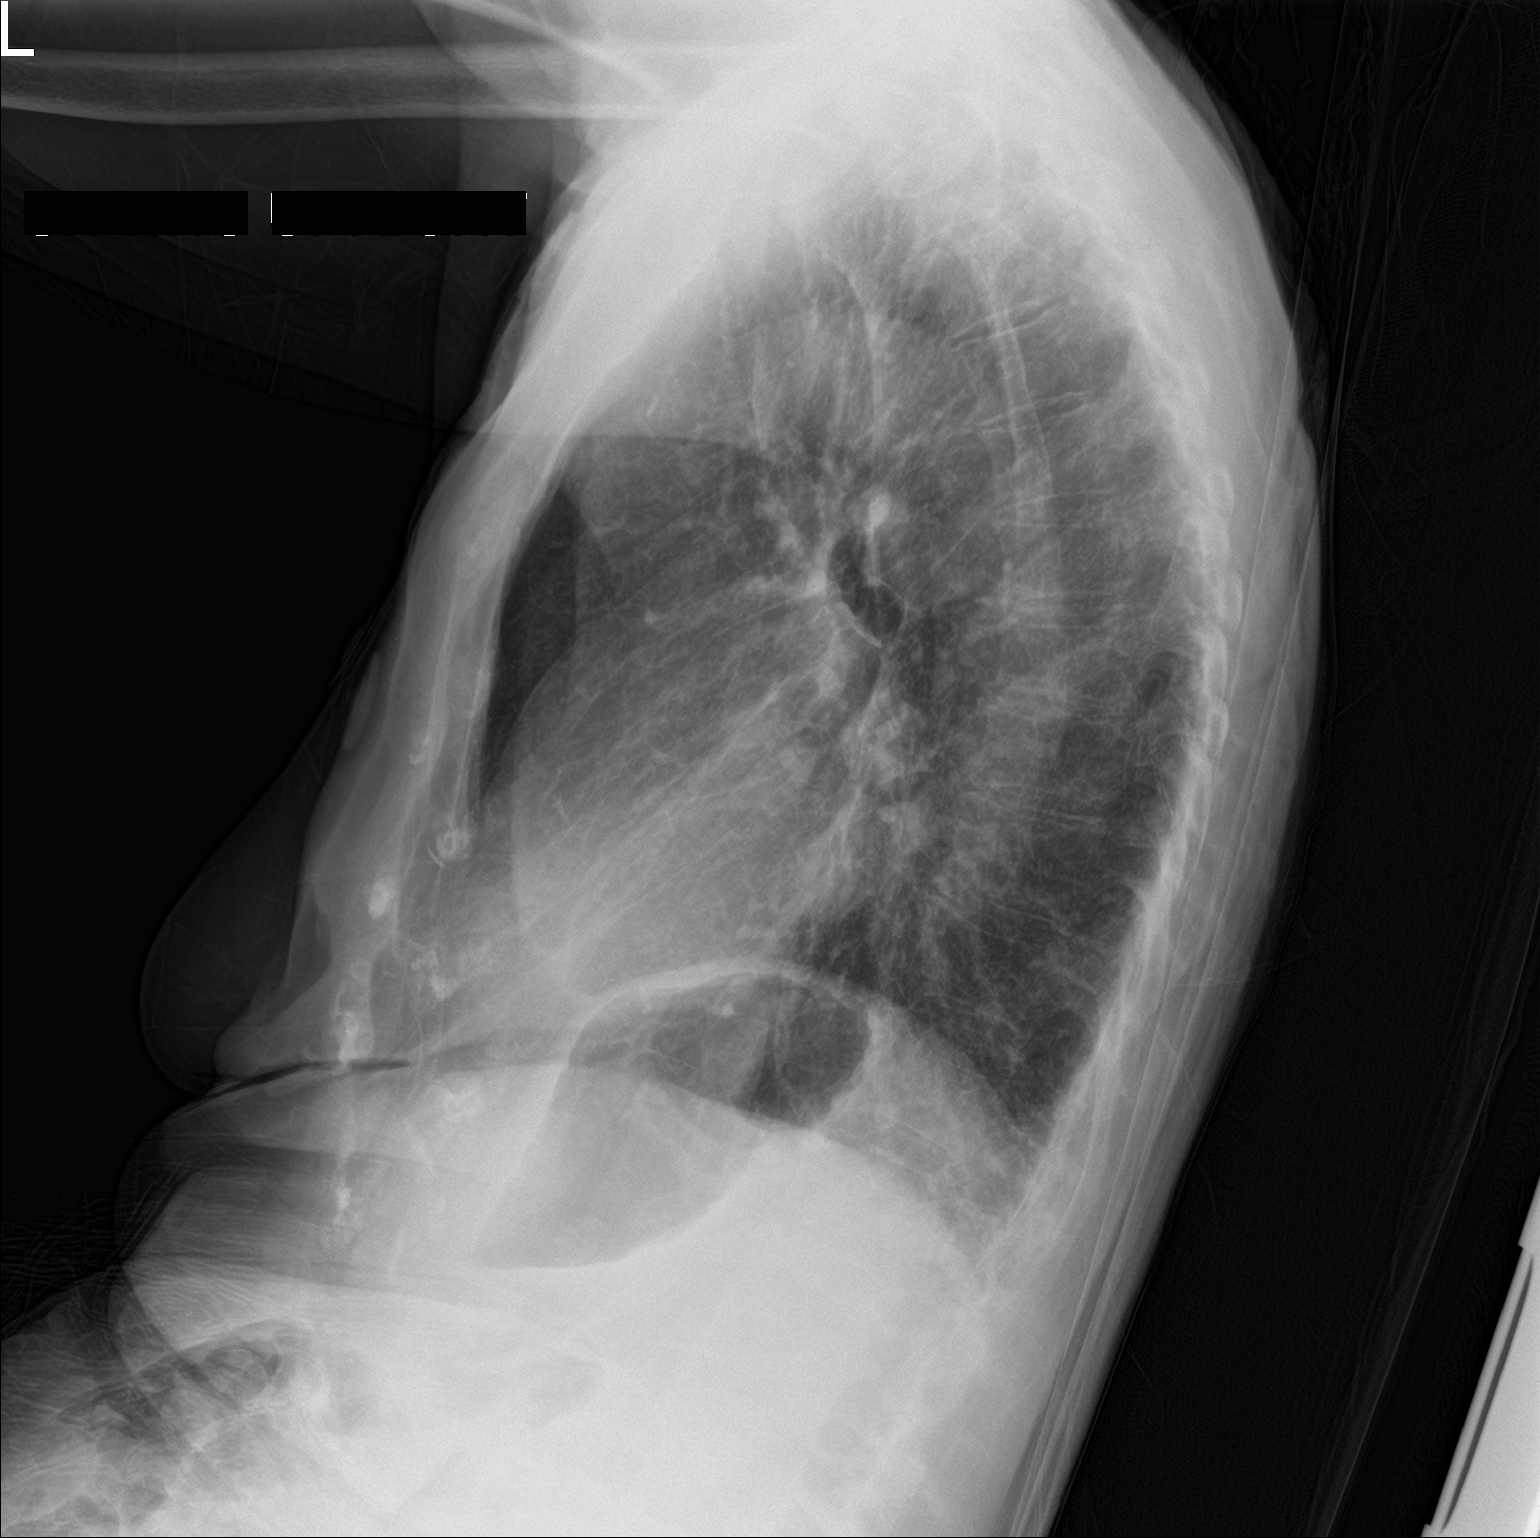

[chest ap]
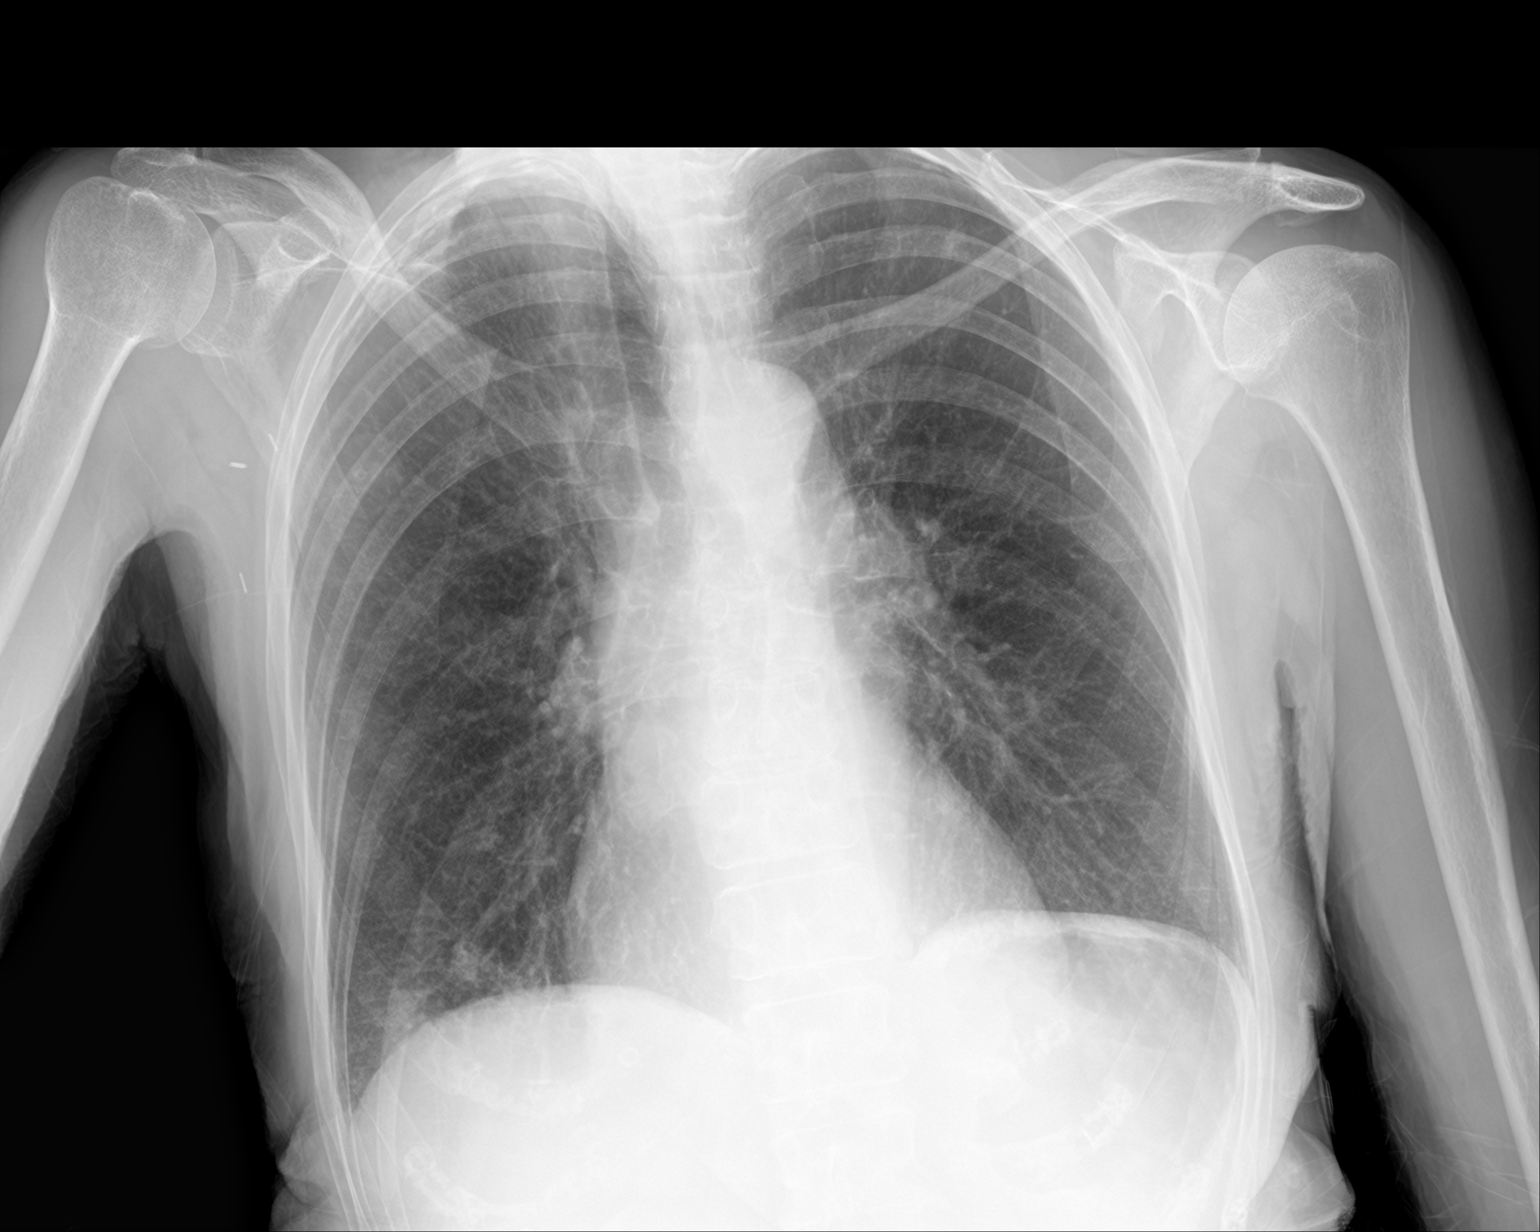

[2 of 2 positions shown; findings below may reference images not displayed]

FINDINGS: Lung volumes are normal. No consolidative airspace disease. No
pleural effusions. No pneumothorax. Several small nodular densities
are noted in the lungs bilaterally, 1 in the left apex, and 2 new
the right lung base. These are not well demonstrated on the lateral
projection. Pulmonary vasculature and the cardiomediastinal
silhouette are within normal limits. Surgical clips in the right
axillary region, likely from prior lymph node dissection.
IMPRESSION: 1. No radiographic evidence of acute cardiopulmonary disease.
2. Several small nodular densities noted in the lungs bilaterally.
Repeat standing PA and lateral chest radiograph is recommended in
1-2 months to confirm the persistence of these findings. Should
these nodules persist, further evaluation with nonemergent
noncontrast chest CT would be recommended.

## 2018-01-18 IMAGING — CT CT HEAD W/O CM
3 of 4 series · 18 of 47 positions shown, 21 images · non-contrast
Comparison: None.

CLINICAL DATA: Altered mental status, headache on anti coagulation.

EXAM:
CT HEAD WITHOUT CONTRAST
TECHNIQUE: Contiguous axial images were obtained from the base of the skull
through the vertex without intravenous contrast.

[Series 201: head w/o, idose (1) · axial · non-contrast · 0.42mm/px · z∈[+265,+385]mm · 12 of 30 slices shown, 15 images]
[im 3/30  brain]
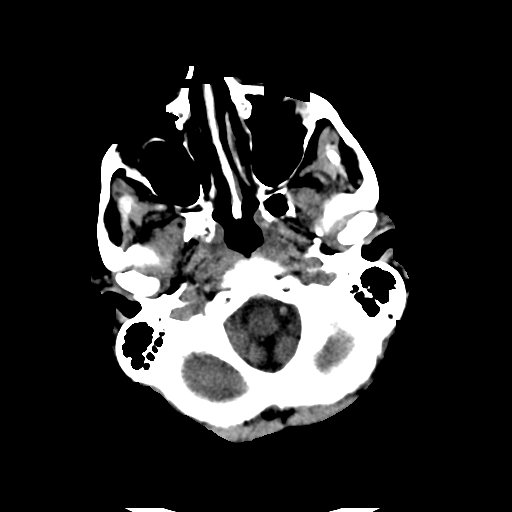
[im 3/30  bone]
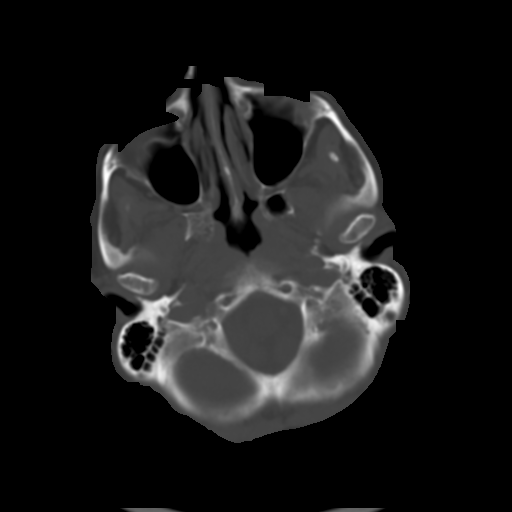
[im 5/30  brain]
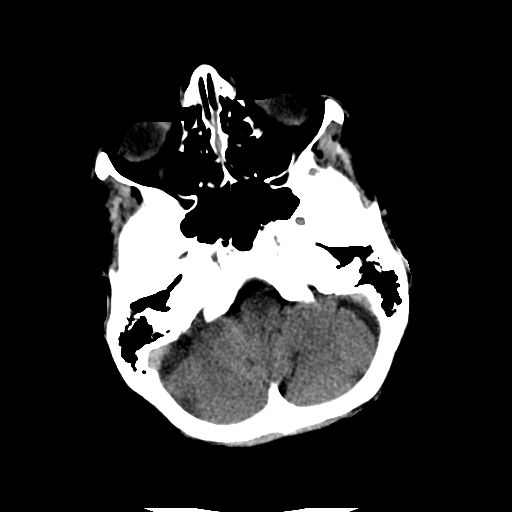
[im 7/30  brain]
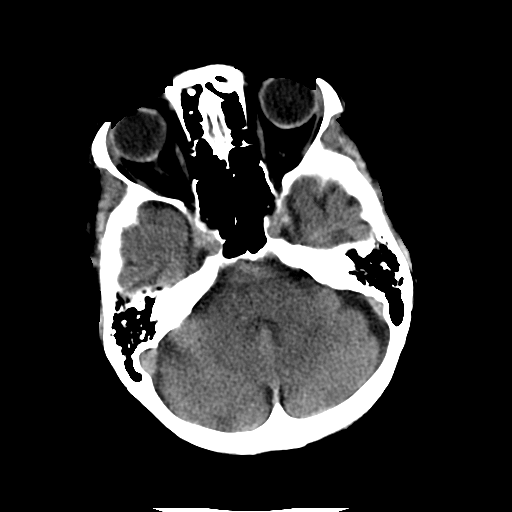
[im 9/30  brain]
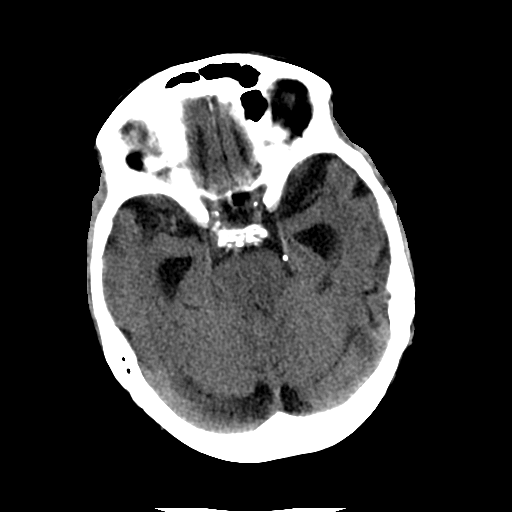
[im 11/30  brain]
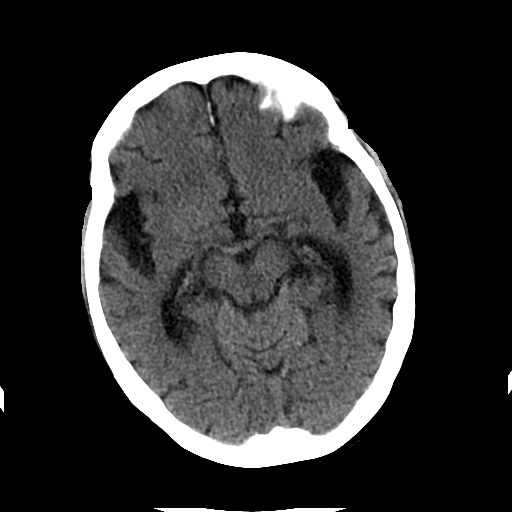
[im 11/30  bone]
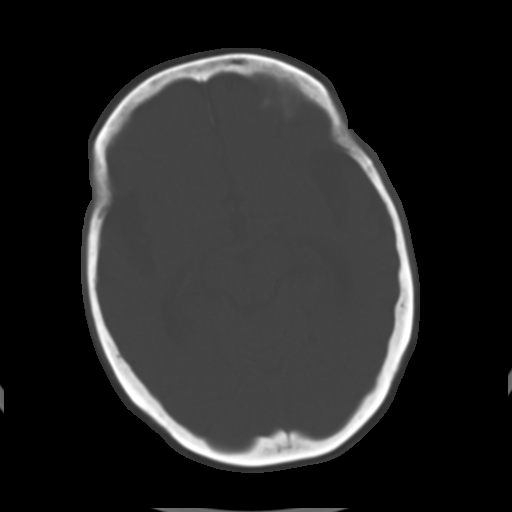
[im 13/30  brain]
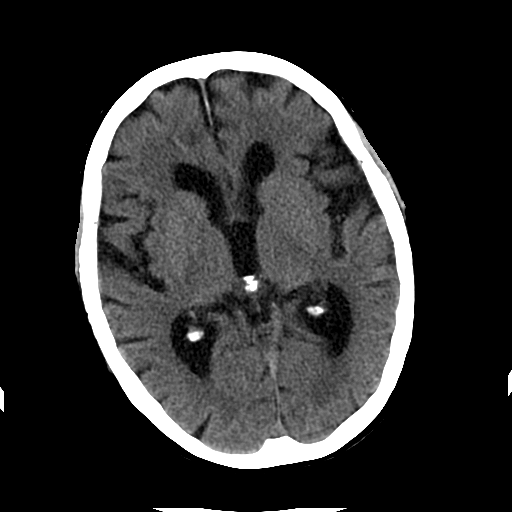
[im 17/30  brain]
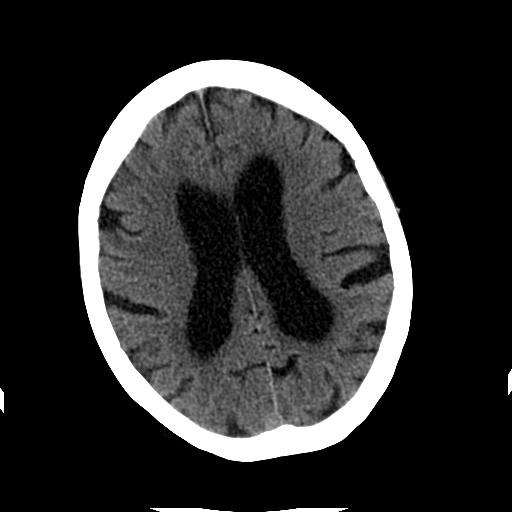
[im 19/30  brain]
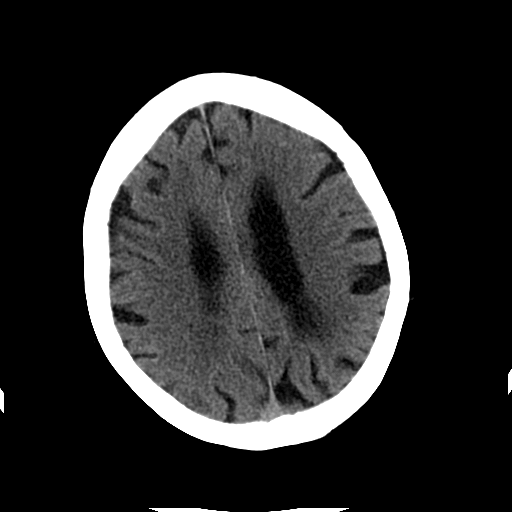
[im 21/30  brain]
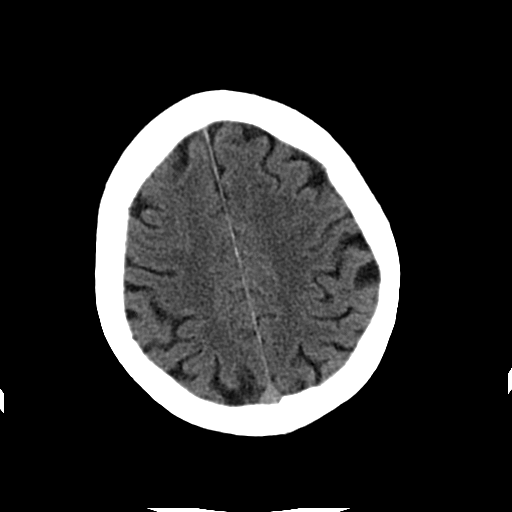
[im 21/30  bone]
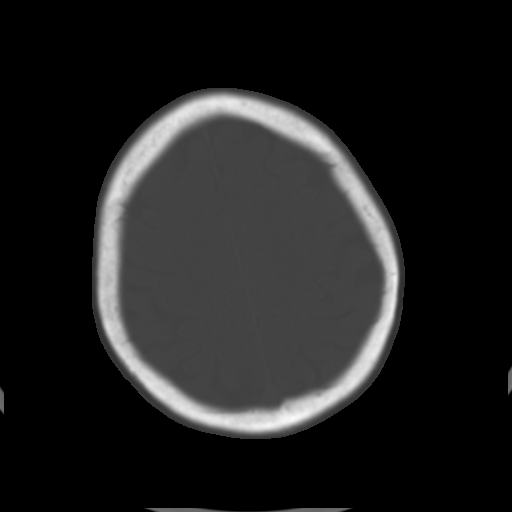
[im 23/30  brain]
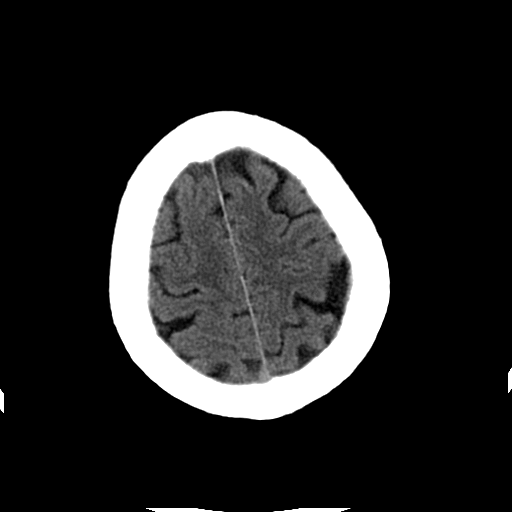
[im 25/30  brain]
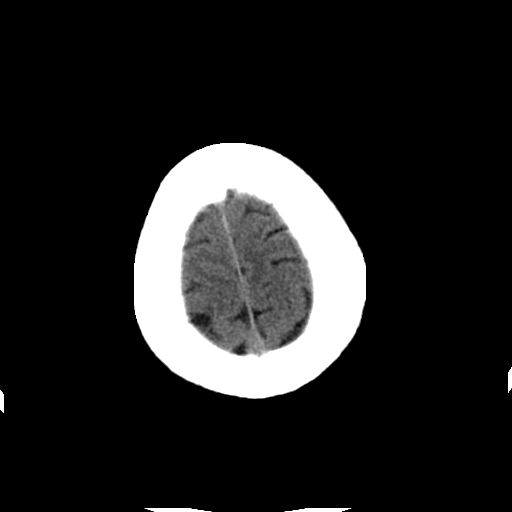
[im 27/30  brain]
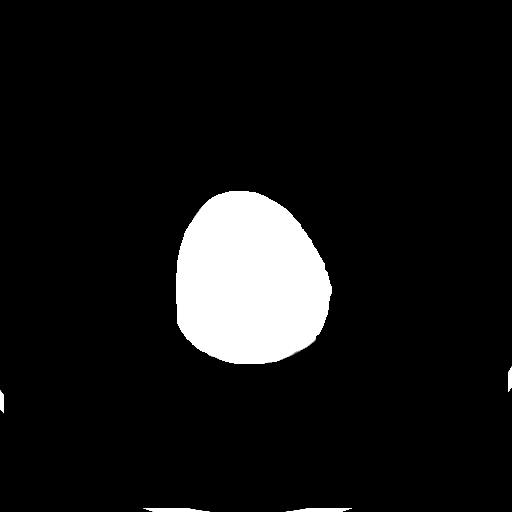

[Series 203: coronal st, idose (1) · coronal · 0.40mm/px · 3 of 66 slices shown]
[im 22/66  brain]
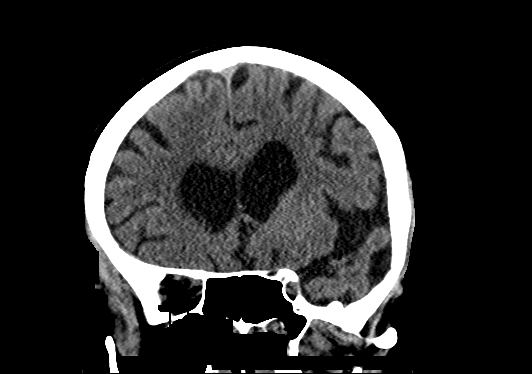
[im 29/66  brain]
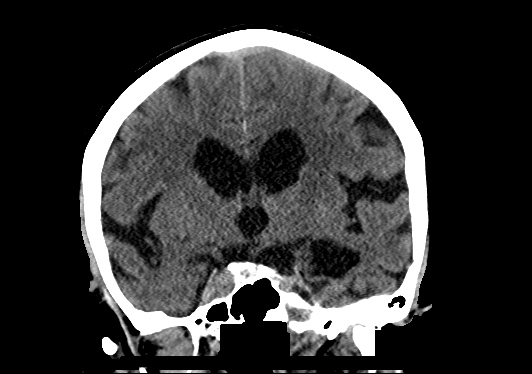
[im 37/66  brain]
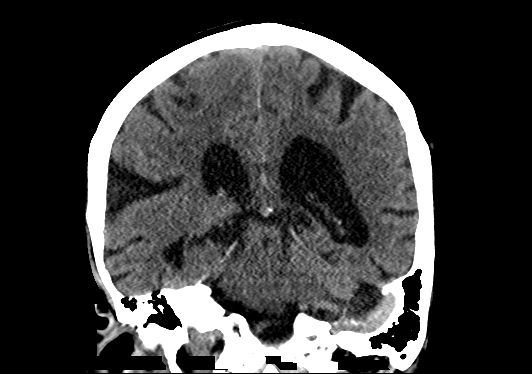

[Series 204: sagittal st, idose (1) · sagittal · 0.40mm/px · 3 of 69 slices shown]
[im 23/69  brain]
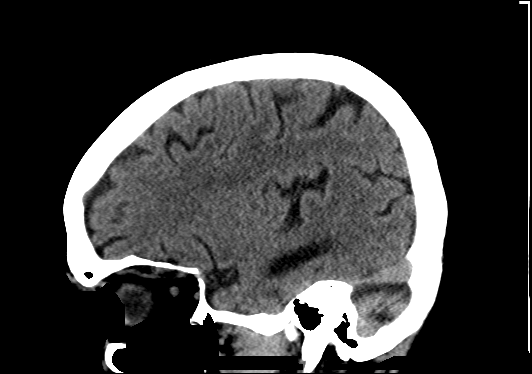
[im 35/69  brain]
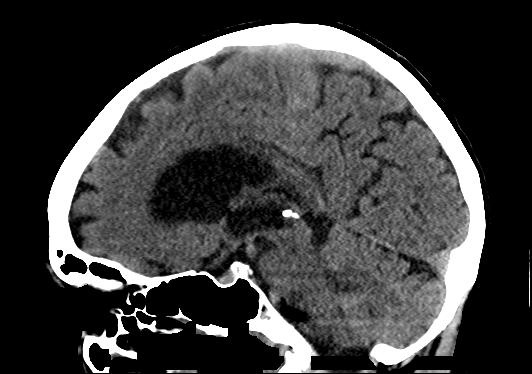
[im 46/69  brain]
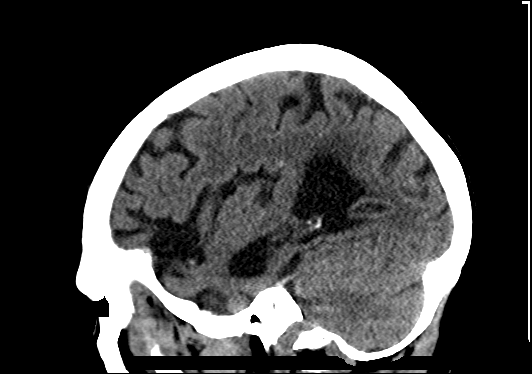

[18 of 47 positions shown; findings below may reference images not displayed]

FINDINGS: BRAIN: There is mild sulcal and moderate ventricular prominence
consistent with superficial and central atrophy. No intraparenchymal
hemorrhage, mass effect nor midline shift. There are patchy
periventricular and subcortical white matter hypodensities
consistent with chronic small vessel ischemic disease. No acute
large vascular territory infarcts. No abnormal extra-axial fluid
collections. Basal cisterns are patent.

VASCULAR: Moderate calcific atherosclerosis of the carotid siphons.

SKULL: No skull fracture. No significant scalp soft tissue swelling.

SINUSES/ORBITS: The mastoid air-cells are clear. The included
paranasal sinuses are well-aerated.The included ocular globes and
orbital contents are non-suspicious.

OTHER: None.
IMPRESSION: No acute intracranial abnormality. Superficial and central atrophy
with chronic appearing small vessel ischemic disease of
periventricular white matter.

## 2019-04-06 DEATH — deceased
